# Patient Record
Sex: Female | Born: 1985 | Race: Black or African American | Hispanic: No | Marital: Single | Smoking: Never smoker
Health system: Southern US, Community
[De-identification: ages and names within clinical notes are randomized; demographics above are authoritative.]

## PROBLEM LIST (undated history)

## (undated) HISTORY — PX: WISDOM TOOTH EXTRACTION: SHX21

## (undated) HISTORY — PX: WRIST SURGERY: SHX841

---

## 2007-06-25 ENCOUNTER — Emergency Department (HOSPITAL_COMMUNITY): Admission: EM | Admit: 2007-06-25 | Discharge: 2007-06-25 | Payer: Self-pay | Admitting: Emergency Medicine

## 2008-06-12 ENCOUNTER — Emergency Department (HOSPITAL_COMMUNITY): Admission: EM | Admit: 2008-06-12 | Discharge: 2008-06-12 | Payer: Self-pay | Admitting: Emergency Medicine

## 2009-08-28 ENCOUNTER — Emergency Department (HOSPITAL_COMMUNITY): Admission: EM | Admit: 2009-08-28 | Discharge: 2009-08-29 | Payer: Self-pay | Admitting: Internal Medicine

## 2009-11-24 ENCOUNTER — Inpatient Hospital Stay (HOSPITAL_COMMUNITY): Admission: AD | Admit: 2009-11-24 | Discharge: 2009-11-26 | Payer: Self-pay | Admitting: Family Medicine

## 2009-11-24 ENCOUNTER — Encounter: Payer: Self-pay | Admitting: Family Medicine

## 2009-11-24 ENCOUNTER — Other Ambulatory Visit: Payer: Self-pay | Admitting: Emergency Medicine

## 2009-11-24 ENCOUNTER — Ambulatory Visit: Payer: Self-pay | Admitting: Obstetrics and Gynecology

## 2009-12-01 ENCOUNTER — Ambulatory Visit: Payer: Self-pay | Admitting: Family Medicine

## 2009-12-01 DIAGNOSIS — R519 Headache, unspecified: Secondary | ICD-10-CM | POA: Insufficient documentation

## 2009-12-01 DIAGNOSIS — R51 Headache: Secondary | ICD-10-CM | POA: Insufficient documentation

## 2009-12-01 DIAGNOSIS — I10 Essential (primary) hypertension: Secondary | ICD-10-CM | POA: Insufficient documentation

## 2009-12-08 ENCOUNTER — Encounter: Payer: Self-pay | Admitting: Family Medicine

## 2009-12-11 ENCOUNTER — Encounter: Payer: Self-pay | Admitting: Family Medicine

## 2009-12-15 ENCOUNTER — Ambulatory Visit: Payer: Self-pay | Admitting: Family Medicine

## 2010-01-08 ENCOUNTER — Telehealth: Payer: Self-pay | Admitting: Family Medicine

## 2010-01-22 ENCOUNTER — Ambulatory Visit: Payer: Self-pay | Admitting: Family Medicine

## 2010-02-02 ENCOUNTER — Ambulatory Visit: Payer: Self-pay | Admitting: Family Medicine

## 2010-07-20 ENCOUNTER — Telehealth: Payer: Self-pay | Admitting: Family Medicine

## 2010-07-27 ENCOUNTER — Emergency Department (HOSPITAL_COMMUNITY)
Admission: EM | Admit: 2010-07-27 | Discharge: 2010-07-27 | Payer: Self-pay | Source: Home / Self Care | Admitting: Emergency Medicine

## 2010-07-28 NOTE — Assessment & Plan Note (Signed)
Summary: post partum, tcb  [facility]  Vital Signs:  Patient profile:   25 year old female Height:      62 inches Weight:      127.6 pounds BMI:     23.42 Temp:     98.1 degrees F BP sitting:   123 / 69  Vitals Entered By: Golden Circle RN (January 22, 2010 3:21 PM) CC: pp   Primary Care Provider:  Milinda Antis MD  CC:  pp.  History of Present Illness:  25 y.o. [id number] here for postpartum visit  Breast Feeding: Adequate milk production per pt. baby breast feeding 5-6 times per day. No breast tenderness, swelling, erythema, or purulent drainage.   Birth control: Pt recieved depo shot at discharge from hospital. Pt interested in IUD for long term birth control.   GU: No dysuria, vaginal bleeding minimal. Now w/ intermittent spotting-much improved from delivery. No fever, purulent discharge, abd pain.   Psych: Pt very happy about having baby, w/ excellent social support at home. Pt's mother has "helped alot" w/ baby per pt. No HI/SI.   HTN: Recently started on HCTZ/Norvasc for new onset HTN in setting of pre-X at time of delivery s/p mag and maternal ICU stayx 24 hours. Pt has not checked BPs at home, however has had marked improvement in baseline HA. Compliant w/ HCTZ and norvasc regimen since starting 11/2009.   Allergies: No Known Drug Allergies  Physical Exam  General:  alert, appropriate dress, and normal appearance.   Head:  normocephalic and atraumatic.   Eyes:  vision grossly intact.   Ears:  R ear normal and L ear normal.   Nose:  no external deformity.   Mouth:  good dentition.   Neck:  supple and full ROM.   Lungs:  CTAB, no wheezes Heart:  RRR, no rubs, gallops, murmurs Abdomen:  S/NT/+bowel sounds Genitalia:  normal introitus and no external lesions.    Pelvic Exam Cervix:      normal, no CMT.  cervix consistent w/ non-pregnant state    Impression & Recommendations:  Problem # 1:  POSTPARTUM EXAMINATION, NORMAL (ICD-V24.2) Overall normal  postpartum exam. GYN anatomy now normalized to non-pregnant states. Reviewed anticipatory guidance about breastfeeding red flags and birth control options. Pt cleared to begin sexual activity. Pt 8 weeks postpartum w/ depo shot prior to d/c. Plan for IUD placement at next visit in 2-4 weeks.  Orders: [facility]- Est Level  3 (56433)  Problem # 2:  ESSENTIAL HYPERTENSION (ICD-401.9) BPs currently well controlled. Pt asymptomatic. WIl likely draw followup BMET at next visit.  Her updated medication list for this problem includes:    Hydrochlorothiazide 25 Mg Tabs (Hydrochlorothiazide) .Marland Kitchen... 1 by mouth daily for hypertension    Norvasc 10 Mg Tabs (Amlodipine besylate) .Marland Kitchen... 1 by mouth daily  Complete Medication List: 1)  Hydrochlorothiazide 25 Mg Tabs (Hydrochlorothiazide) .Marland Kitchen.. 1 by mouth daily for hypertension 2)  Norvasc 10 Mg Tabs (Amlodipine besylate) .Marland Kitchen.. 1 by mouth daily 3)  Fioricet 50-325-40 Mg Tabs (Butalbital-apap-caffeine) .Marland Kitchen.. 1 tab at onset of headache, may repeat in 4 hours

## 2010-07-28 NOTE — Letter (Signed)
Summary: FMLA  FMLA   Imported By: Clydell Hakim 12/11/2009 13:55:13  _____________________________________________________________________  External Attachment:    Type:   Image     Comment:   External Document

## 2010-07-28 NOTE — Assessment & Plan Note (Signed)
Summary: [facility]U VISIT/[facility]  pt rescheduled for iud  Vital Signs:  Patient profile:   25 year old female Height:      62 inches Temp:     98.8 degrees F oral Pulse rate:   78 / minute BP sitting:   118 / 67  (left arm) Cuff size:   regular  Vitals Entered By: Tessie Fass CMA (February 02, 2010 4:23 PM)  Allergies: No Known Drug Allergies   Complete Medication List: 1)  Hydrochlorothiazide 25 Mg Tabs (Hydrochlorothiazide) .Marland Kitchen.. 1 by mouth daily for hypertension 2)  Norvasc 10 Mg Tabs (Amlodipine besylate) .Marland Kitchen.. 1 by mouth daily 3)  Fioricet 50-325-40 Mg Tabs (Butalbital-apap-caffeine) .Marland Kitchen.. 1 tab at onset of headache, may repeat in 4 hours  Other Orders: No Charge Patient Arrived (NCPA0) (NCPA0)  Appended Document: [facility]U VISIT/[facility] Appt rescheduled for IUD placement. Will followup in 1-2 weeks. Doree Albee MD {DATETIMESTAMP()}

## 2010-07-28 NOTE — Assessment & Plan Note (Signed)
Summary: postpartum bp check,df   Vital Signs:  Patient profile:   25 year old female Weight:      143 pounds Temp:     98.2 degrees F oral Pulse rate:   99 / minute BP sitting:   146 / 97  (left arm) Cuff size:   regular  Vitals Entered By: Tessie Fass CMA (December 01, 2009 10:02 AM)  Serial Vital Signs/Assessments:  Time      Position  BP       Pulse  Resp  Temp     By                     144/92                         Milinda Antis MD  CC: BP check Pain Assessment Patient in pain? no        CC:  BP check.  History of Present Illness:   25 y.o. G2P1011 presents for hospital follow s/p delviery for BP check, Pt did not have any prenatal care, per report has irregular cycles and thought she was approx 4 months pregnant. She presented with SROM , full term delivery and pre-ecclampsia BP elevated to 182/109. Placed in ICU s/p NSVD , given  Magnesium. At discharge started on HCTZ 25mg  and Norvasc 10mg . Pt admitted prior to presented to [facility]U has appt for Wed prior secondary to extreme swelling in legs and hands as well as severe headaches. No prior history of HTN  Continues to have headaches daily, no vision changes, feel like severe migraines at time, Tylenol helps some , swelling in legs improving, denies N/V.    Habits & Providers  Alcohol-Tobacco-Diet     Tobacco Status: never  Current Medications (verified): 1)  Hydrochlorothiazide 25 Mg Tabs (Hydrochlorothiazide) .Marland Kitchen.. 1 By Mouth Daily For Hypertension 2)  Norvasc 10 Mg Tabs (Amlodipine Besylate) .Marland Kitchen.. 1 By Mouth Daily 3)  Fioricet 50-325-40 Mg Tabs (Butalbital-Apap-Caffeine) .Marland Kitchen.. 1 Tab At Onset of Headache, May Repeat in 4 Hours  Allergies (verified): No Known Drug Allergies  Past History:  Past Medical History: Vit D deficient Lactose intolerant  H/o Trichomonas Vaginalis [id number] - NSVD May 2011 with Pre-ecclampsia     Family History: Father- HTN , Gout Father's side- Alzhiemers Dementia Mother- Mild  Asthma  GM- glaucoma   No family history of DM  Social History: Lives with boyfriend, and baby Never smoked, alcohol occasionally, no illicit drug use Exercise  Works Surveyor, minerals at [employer]- plans for degree in ChemistrySmoking Status:  never  Physical Exam  General:  NAD, pleaseant female, Vital signs noted  Eyes:  Contacts, PERRL,EOMI, Optic disc normal Neck:  supple.   Lungs:  CTAB Heart:  RRR, no murmur Pulses:  2+ Extremities:  2 + pitting edema to Knee bilat, hyperpigmentation over feet bilat  Neurologic:  alert & oriented X3, cranial nerves II-XII intact, and strength normal in all extremities.     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 BP has greatly improved on 2 drug regimine. Recheck in 2 weeks.  Her updated medication list for this problem includes:    Hydrochlorothiazide 25 Mg Tabs (Hydrochlorothiazide) .Marland Kitchen... 1 by mouth daily for hypertension    Norvasc  10 Mg Tabs (Amlodipine besylate) .Marland Kitchen... 1 by mouth daily  Problem # 2:  HEADACHE (ICD-784.0) Assessment: New Related to pre-ecclampsia, noted improvment with treatment of BP, given red flags, as breastfeeding given Fiorcet no NSAIDS at this time Her updated medication list for this problem includes:    Fioricet 50-325-40 Mg Tabs (Butalbital-apap-caffeine) .Marland Kitchen... 1 tab at onset of headache, may repeat in 4 hours  Complete Medication List: 1)  Hydrochlorothiazide 25 Mg Tabs (Hydrochlorothiazide) .Marland Kitchen.. 1 by mouth daily for hypertension 2)  Norvasc 10 Mg Tabs (Amlodipine besylate) .Marland Kitchen.. 1 by mouth daily 3)  Fioricet 50-325-40 Mg Tabs (Butalbital-apap-caffeine) .Marland Kitchen.. 1 tab at onset of headache, may repeat in 4 hours  Patient Instructions: 1)  It was very nice to meet you! Congratulations  on your new baby. 2)  Return to for your post partum check in 6 weeks, please schedule at the front 3)  Continue your current meds. 4)  Return in 2 weeks for have your blood pressure checked by the nurse 5)  If you have severe headache, change in vision, worse swelling, nausea please call us or go to nearest ER  Prescriptions: FIORICET 50-325-40 MG TABS (BUTALBITAL-APAP-CAFFEINE) 1 tab at onset of headache, may repeat in 4 hours  #30 x 0   Entered and Authorized by:   Milinda Antis MD   Signed by:   Milinda Antis MD on 12/01/2009   Method used:   Handwritten   RxID:   [id number] NORVASC 10 MG TABS (AMLODIPINE BESYLATE) 1 by mouth daily  #30 x 0   Entered and Authorized by:   Milinda Antis MD   Signed by:   Milinda Antis MD on 12/01/2009   Method used:   Historical   RxID:   [id number] HYDROCHLOROTHIAZIDE 25 MG TABS (HYDROCHLOROTHIAZIDE) 1 by mouth daily for hypertension  #30 x 0   Entered and Authorized by:   Milinda Antis MD   Signed by:   Milinda Antis MD on 12/01/2009   Method used:   Historical   RxID:   [id number]

## 2010-07-28 NOTE — Progress Notes (Signed)
Summary: Letter  Phone Note Call from Patient Call back at Home Phone [phone]   Reason for Call: Talk to Nurse Summary of Call: would like to get a letter for her mom so she can come help her with the baby Initial call taken by: Knox Royalty,  January 08, 2010 9:27 AM  Follow-up for Phone Call        called pt. pt needs refills of all of her meds (3). her pharmacy is [facility] [address]. c/o bad migraine. out of meds. told pt that we will refill her meds and advised to keep appt. pt request for dr.Spickard to fax a letter to her @ [phone], stating that she needs more help with her baby from her mom due to her headaches. i told the pt that i would fwd her request to dr.Cairo. Follow-up by: Arlyss Repress CMA,,  January 08, 2010 9:41 AM  Additional Follow-up for Phone Call Additional follow up Details #1::        I spoke with pt, I will not be able to give a specific letter for her mother, I have completed the FMLA form, her mother is not a pt and neither is her child. I advised her mother to call her own pcp, since her FMLA is pending, this was given approx 5 weeks ago. Will refill bp meds Additional Follow-up by: Milinda Antis MD,  January 08, 2010 12:33 PM    Prescriptions: Marikay Alar 50-325-40 MG TABS [facility]) 1 tab at onset of headache, may repeat in 4 hours  #30 x 0   Entered and Authorized by:   Milinda Antis MD   Signed by:   Milinda Antis MD on 01/08/2010   Method used:   Electronically to        [facility]. [phone]* (retail)       [address]       [place], [place]  [address]       Ph: [phone]       Fax: [phone]   RxID:   [id number] HYDROCHLOROTHIAZIDE 25 MG TABS (HYDROCHLOROTHIAZIDE) 1 by mouth daily for hypertension  #30 x 1   Entered and Authorized by:   Milinda Antis MD   Signed by:   Milinda Antis MD on 01/08/2010   Method used:   Electronically to        [facility]. [phone]*  (retail)       [address]       [place], [place]  [address]       Ph: [phone]       Fax: [phone]   RxID:   [id number] NORVASC 10 MG TABS (AMLODIPINE BESYLATE) 1 by mouth daily  #30 x 1   Entered and Authorized by:   Milinda Antis MD   Signed by:   Milinda Antis MD on 01/08/2010   Method used:   Electronically to        [facility]. [phone]* (retail)       [address]       [place], [place]  [address]       Ph: [phone]       Fax: [phone]   RxID:   [id number]

## 2010-07-28 NOTE — Assessment & Plan Note (Signed)
Summary: bp check/[facility]  Nurse Visit  patient in office for BP recheck. she started on medciation after the birth of her baby 3 weeks ago.. she did take medication this AM. states she is on norvasc and HCTZ. BP cheked manually after resting 10 minutes BP LA 1101/74, RA 108/74 pulse 88. has a follow up appointment with Dr. Hubbard Robinson on 01/12/2010. Theresia Lo RN  December 15, 2009 9:42 AM   Allergies: No Known Drug Allergies  Orders Added: 1)  No Charge Patient Arrived (NCPA0) [NCPA0]

## 2010-07-28 NOTE — Miscellaneous (Signed)
Summary: ROI  ROI   Imported By: Bradly Bienenstock 12/08/2009 10:23:48  _____________________________________________________________________  External Attachment:    Type:   Image     Comment:   External Document

## 2010-07-29 ENCOUNTER — Encounter: Payer: Self-pay | Admitting: *Deleted

## 2010-07-30 NOTE — Progress Notes (Signed)
Summary: triage  Phone Note Call from Patient Call back at Home Phone [phone]   Caller: Patient Summary of Call: wants to know what she can take for a cold/congestion Initial call taken by: De Nurse,  July 20, 2010 2:03 PM  Follow-up for Phone Call        SHE IS BREASTFEEDING.  sick x 4 days. spoke with Dr. Tressia Danas. nasal spray & nyquil advised. may suck on hard candy & get plenty of fluids. son is 67 months old with teeth so she pumps & FOB feeds him a bottle at night. discussed how all of the other cold remedies will dry up her milk supply to move the humidifier into her bedroom Follow-up by: Golden Circle RN,  July 20, 2010 2:53 PM

## 2010-09-14 LAB — DIFFERENTIAL
Basophils Absolute: 0 10*3/uL (ref 0.0–0.1)
Basophils Relative: 0 % (ref 0–1)
Eosinophils Absolute: 0 10*3/uL (ref 0.0–0.7)
Lymphocytes Relative: 16 % (ref 12–46)

## 2010-09-14 LAB — CBC
HCT: 29.8 % — ABNORMAL LOW (ref 36.0–46.0)
HCT: 32.6 % — ABNORMAL LOW (ref 36.0–46.0)
HCT: 34 % — ABNORMAL LOW (ref 36.0–46.0)
Hemoglobin: 10.9 g/dL — ABNORMAL LOW (ref 12.0–15.0)
MCHC: 33.4 g/dL (ref 30.0–36.0)
MCHC: 33.5 g/dL (ref 30.0–36.0)
MCV: 89.4 fL (ref 78.0–100.0)
Platelets: 200 10*3/uL (ref 150–400)
Platelets: 210 10*3/uL (ref 150–400)
RBC: 3.33 MIL/uL — ABNORMAL LOW (ref 3.87–5.11)
RBC: 3.71 MIL/uL — ABNORMAL LOW (ref 3.87–5.11)
RDW: 17 % — ABNORMAL HIGH (ref 11.5–15.5)
RDW: 17.1 % — ABNORMAL HIGH (ref 11.5–15.5)
WBC: 10.5 10*3/uL (ref 4.0–10.5)
WBC: 15.2 10*3/uL — ABNORMAL HIGH (ref 4.0–10.5)
WBC: 19.1 10*3/uL — ABNORMAL HIGH (ref 4.0–10.5)

## 2010-09-14 LAB — URINALYSIS, ROUTINE W REFLEX MICROSCOPIC
Ketones, ur: NEGATIVE mg/dL
Nitrite: NEGATIVE
Protein, ur: >300 mg/dL — AB
Specific Gravity, Urine: 1.02 (ref 1.005–1.030)
pH: 6.5 (ref 5.0–8.0)

## 2010-09-14 LAB — HEPATITIS B SURFACE ANTIGEN: Hepatitis B Surface Ag: NEGATIVE

## 2010-09-14 LAB — GC/CHLAMYDIA PROBE AMP, GENITAL
Chlamydia, DNA Probe: NEGATIVE
GC Probe Amp, Genital: NEGATIVE

## 2010-09-14 LAB — WET PREP, GENITAL
Clue Cells Wet Prep HPF POC: NONE SEEN
WBC, Wet Prep HPF POC: NONE SEEN
Yeast Wet Prep HPF POC: NONE SEEN

## 2010-09-14 LAB — RPR: RPR Ser Ql: NONREACTIVE

## 2010-09-14 LAB — COMPREHENSIVE METABOLIC PANEL
ALT: 15 U/L (ref 0–35)
AST: 31 U/L (ref 0–37)
Albumin: 1.9 g/dL — ABNORMAL LOW (ref 3.5–5.2)
Albumin: 2.1 g/dL — ABNORMAL LOW (ref 3.5–5.2)
Alkaline Phosphatase: 137 U/L — ABNORMAL HIGH (ref 39–117)
BUN: 4 mg/dL — ABNORMAL LOW (ref 6–23)
CO2: 23 mEq/L (ref 19–32)
Chloride: 107 mEq/L (ref 96–112)
GFR calc Af Amer: >60 mL/min (ref >60)
GFR calc Af Amer: >60 mL/min (ref >60)
Sodium: 135 mEq/L (ref 135–145)
Total Bilirubin: 0.5 mg/dL (ref 0.3–1.2)
Total Protein: 5.3 g/dL — ABNORMAL LOW (ref 6.0–8.3)

## 2010-09-14 LAB — RAPID URINE DRUG SCREEN, HOSP PERFORMED
Cocaine: NOT DETECTED
Opiates: NOT DETECTED
Tetrahydrocannabinol: NOT DETECTED

## 2010-09-14 LAB — TYPE AND SCREEN: Antibody Screen: NEGATIVE

## 2010-09-14 LAB — URINE MICROSCOPIC-ADD ON

## 2010-09-14 LAB — RUBELLA SCREEN: Rubella: 39.5 IU/mL — ABNORMAL HIGH

## 2010-09-14 LAB — MRSA PCR SCREENING: MRSA by PCR: NEGATIVE

## 2010-09-14 LAB — ABO/RH: ABO/RH(D): O POS

## 2012-09-28 ENCOUNTER — Ambulatory Visit: Payer: Self-pay

## 2012-09-28 ENCOUNTER — Other Ambulatory Visit: Payer: Self-pay | Admitting: Occupational Medicine

## 2012-09-28 DIAGNOSIS — Z021 Encounter for pre-employment examination: Secondary | ICD-10-CM

## 2013-06-04 ENCOUNTER — Emergency Department (HOSPITAL_COMMUNITY)
Admission: EM | Admit: 2013-06-04 | Discharge: 2013-06-04 | Disposition: A | Discharge status: Home / Self Care | Payer: BC Managed Care – PPO | Attending: Emergency Medicine | Admitting: Emergency Medicine

## 2013-06-04 ENCOUNTER — Encounter (HOSPITAL_COMMUNITY): Payer: Self-pay | Admitting: Emergency Medicine

## 2013-06-04 ENCOUNTER — Emergency Department (HOSPITAL_COMMUNITY): Payer: BC Managed Care – PPO

## 2013-06-04 DIAGNOSIS — R05 Cough: Secondary | ICD-10-CM | POA: Insufficient documentation

## 2013-06-04 DIAGNOSIS — Z79899 Other long term (current) drug therapy: Secondary | ICD-10-CM | POA: Insufficient documentation

## 2013-06-04 DIAGNOSIS — R509 Fever, unspecified: Secondary | ICD-10-CM | POA: Insufficient documentation

## 2013-06-04 DIAGNOSIS — N12 Tubulo-interstitial nephritis, not specified as acute or chronic: Secondary | ICD-10-CM

## 2013-06-04 DIAGNOSIS — IMO0001 Reserved for inherently not codable concepts without codable children: Secondary | ICD-10-CM | POA: Insufficient documentation

## 2013-06-04 DIAGNOSIS — R059 Cough, unspecified: Secondary | ICD-10-CM | POA: Insufficient documentation

## 2013-06-04 DIAGNOSIS — J3489 Other specified disorders of nose and nasal sinuses: Secondary | ICD-10-CM | POA: Insufficient documentation

## 2013-06-04 DIAGNOSIS — R5381 Other malaise: Secondary | ICD-10-CM | POA: Insufficient documentation

## 2013-06-04 LAB — URINALYSIS, ROUTINE W REFLEX MICROSCOPIC
Bilirubin Urine: NEGATIVE
Glucose, UA: NEGATIVE mg/dL
Ketones, ur: NEGATIVE mg/dL
Nitrite: POSITIVE — AB
Protein, ur: 30 mg/dL — AB
Specific Gravity, Urine: 1.013 (ref 1.005–1.030)
Urobilinogen, UA: 1 mg/dL (ref 0.0–1.0)
pH: 5.5 (ref 5.0–8.0)

## 2013-06-04 LAB — URINE MICROSCOPIC-ADD ON

## 2013-06-04 MED ORDER — CIPROFLOXACIN HCL 500 MG PO TABS: 500.0000 mg | ORAL_TABLET | Freq: Two times a day (BID) | ORAL | Status: DC

## 2013-06-04 MED ORDER — IBUPROFEN 800 MG PO TABS
800.0000 mg | ORAL_TABLET | Freq: Once | ORAL | Status: AC
  Administered 2013-06-04: 800 mg via ORAL
  Filled 2013-06-04: qty 1

## 2013-06-04 MED ORDER — PHENAZOPYRIDINE HCL 200 MG PO TABS: 200.0000 mg | ORAL_TABLET | Freq: Three times a day (TID) | ORAL | Status: DC

## 2013-06-04 NOTE — ED Provider Notes (Signed)
CSN: [id number]     Arrival date & time 06/04/13  1318 History   First MD Initiated Contact with Patient 06/04/13 1507     Chief Complaint  Patient presents with  . Back Pain   (Consider location/radiation/quality/duration/timing/severity/associated sxs/prior Treatment) HPI Pt is a 27yo female c/o back pain and generalized weakness, associated with fever of 102-103.  Pt states she went to [facility] walk-in clinic Friday, 12/5, dx with "a touch of pneumonia" advised to alternate tylenol and ibuprofen for fevers every 4 hours but states she has not felt any improvement in her symptoms. Still reports generalized fatigue and recurrent fevers with aching, sore back, 8/10. Also reports mild productive cough with mild sinus congestion. Does report some urinary frequency but no hematuria or dysuria. Denies vaginal pain or discharge. Currently on her menstrual cycle. Pt states she is generally healthy, no daily medications. Denies hx of asthma.     History reviewed. No pertinent past medical history. History reviewed. No pertinent past surgical history. No family history on file. History  Substance Use Topics  . Smoking status: Never Smoker   . Smokeless tobacco: Not on file  . Alcohol Use: No   OB History   Grav Para Term Preterm Abortions TAB SAB Ect Mult Living                 Review of Systems  Constitutional: Positive for fever, chills and fatigue. Negative for appetite change and unexpected weight change.  HENT: Positive for congestion.   Respiratory: Positive for cough ( mild). Negative for shortness of breath.   Cardiovascular: Negative for chest pain.  Gastrointestinal: Negative for nausea, vomiting and abdominal pain.  Genitourinary: Positive for frequency. Negative for dysuria, hematuria, flank pain, vaginal bleeding, vaginal discharge, menstrual problem and pelvic pain.  Musculoskeletal: Positive for back pain and myalgias.  All other systems reviewed and are  negative.    Allergies  Review of patient's allergies indicates no known allergies.  Home Medications   Current Outpatient Rx  Name  Route  Sig  Dispense  Refill  . norgestrel-ethinyl estradiol (LO/OVRAL,CRYSELLE) 0.3-30 MG-MCG tablet   Oral   Take 1 tablet by mouth daily.         Marland Kitchen OVER THE COUNTER MEDICATION   Oral   Take 1 capsule by mouth daily. Hair, Skin and Nails         . ciprofloxacin (CIPRO) 500 MG tablet   Oral   Take 1 tablet (500 mg total) by mouth every 12 (twelve) hours.   20 tablet   0   . phenazopyridine (PYRIDIUM) 200 MG tablet   Oral   Take 1 tablet (200 mg total) by mouth 3 (three) times daily.   6 tablet   0    BP 115/59  Pulse 82  Temp(Src) 99.3 F (37.4 C) (Oral)  Resp 20  Ht 5\' 2"  (1.575 m)  Wt 140 lb (63.504 kg)  BMI 25.60 kg/m2  SpO2 99%  LMP 06/04/2013 Physical Exam  Nursing note and vitals reviewed. Constitutional: She appears well-developed and well-nourished. No distress.  Pt appears well, non-toxic. NAD.  HENT:  Head: Normocephalic and atraumatic.  Eyes: Conjunctivae are normal. No scleral icterus.  Neck: Normal range of motion.  Cardiovascular: Normal rate, regular rhythm and normal heart sounds.   Pulmonary/Chest: Effort normal and breath sounds normal. No respiratory distress. She has no wheezes. She has no rales. She exhibits no tenderness.  No respiratory distress, able to speak in full sentences w/o difficulty.  Lungs: CTAB   Abdominal: Soft. Bowel sounds are normal. She exhibits no distension and no mass. There is no tenderness. There is no rebound and no guarding.  Soft, non-distended, non-tender.  Musculoskeletal: Normal range of motion. She exhibits tenderness. She exhibits no edema.  Diffuse muscular tenderness over back  Neurological: She is alert.  Skin: Skin is warm and dry. She is not diaphoretic.    ED Course  Procedures (including critical care time) Labs Review Labs Reviewed  URINALYSIS, ROUTINE W  REFLEX MICROSCOPIC - Abnormal; Notable for the following:    APPearance CLOUDY (*)    Hgb urine dipstick LARGE (*)    Protein, ur 30 (*)    Nitrite POSITIVE (*)    Leukocytes, UA LARGE (*)    All other components within normal limits  URINE MICROSCOPIC-ADD ON - Abnormal; Notable for the following:    Squamous Epithelial / LPF FEW (*)    Bacteria, UA MANY (*)    All other components within normal limits  URINE CULTURE   Imaging Review Dg Chest 2 View  06/04/2013   CLINICAL DATA:  Shortness of breath, fever and cough.  EXAM: CHEST  2 VIEW  COMPARISON:  09/28/2012.  FINDINGS: The heart size and mediastinal contours are within normal limits. Both lungs are clear. The visualized skeletal structures are unremarkable.  IMPRESSION: No active cardiopulmonary disease.   Electronically Signed   By: Amie Portland M.D.   On: 06/04/2013 16:35    EKG Interpretation   None       MDM   1. Pyelonephritis    Pt presenting with back pain, fevers, generalized weakness and urinary frequency.  Was dx Friday, 12/5, with "a touch of pneumonia." Today, pt appears well, non-toxic. Pt reports mild productive cough, lungs: CTAB, although due to pt's concern for pneumonia, will get CXR.  Will also get UA as pt reports urinary frequency and back pain but no abdominal pain.    CXR: unremarkable UA: significant for urinary infection, nitrites-positive, WBC-TNTC.  Due to pt's severe back pain and reported fevers of 102-103, will tx for pyelonephritis.  Rx: cirpo x10 days. Advised to f/u with PCP in 3 days if not improving, return precautions provided. Pt verbalized understanding and agreement with tx plan.     Junius Finner, PA-C 06/04/13 1735

## 2013-06-04 NOTE — ED Notes (Signed)
Pt. reports pain at entire back , bilateral shoulders , generalized weakness and headache for several days , seen at [facility] walk-in clinic diagnosed with " touch of pneumonia " advised to take OTC Tylenol and Ibuprofen with no relief.

## 2013-06-05 NOTE — ED Provider Notes (Signed)
Medical screening examination/treatment/procedure(s) were performed by non-physician practitioner and as supervising physician I was immediately available for consultation/collaboration.  EKG Interpretation   None        Raeford Razor, MD 06/05/13 1454

## 2013-06-06 LAB — URINE CULTURE: Colony Count: >=100000

## 2015-01-27 ENCOUNTER — Encounter (HOSPITAL_COMMUNITY): Payer: Self-pay | Admitting: *Deleted

## 2015-01-27 ENCOUNTER — Inpatient Hospital Stay (HOSPITAL_COMMUNITY)
Admission: AD | Admit: 2015-01-27 | Discharge: 2015-01-27 | Disposition: A | Discharge status: Home / Self Care | Payer: BLUE CROSS/BLUE SHIELD | Source: Ambulatory Visit | Attending: Obstetrics and Gynecology | Admitting: Obstetrics and Gynecology

## 2015-01-27 ENCOUNTER — Inpatient Hospital Stay (HOSPITAL_COMMUNITY): Payer: BLUE CROSS/BLUE SHIELD

## 2015-01-27 DIAGNOSIS — Z6791 Unspecified blood type, Rh negative: Secondary | ICD-10-CM | POA: Insufficient documentation

## 2015-01-27 DIAGNOSIS — O4690 Antepartum hemorrhage, unspecified, unspecified trimester: Secondary | ICD-10-CM | POA: Insufficient documentation

## 2015-01-27 DIAGNOSIS — O26899 Other specified pregnancy related conditions, unspecified trimester: Secondary | ICD-10-CM | POA: Diagnosis not present

## 2015-01-27 DIAGNOSIS — O3680X0 Pregnancy with inconclusive fetal viability, not applicable or unspecified: Secondary | ICD-10-CM

## 2015-01-27 DIAGNOSIS — Z6790 Unspecified blood type, Rh positive: Secondary | ICD-10-CM

## 2015-01-27 DIAGNOSIS — Z3A00 Weeks of gestation of pregnancy not specified: Secondary | ICD-10-CM | POA: Insufficient documentation

## 2015-01-27 DIAGNOSIS — O209 Hemorrhage in early pregnancy, unspecified: Secondary | ICD-10-CM

## 2015-01-27 LAB — HCG, QUANTITATIVE, PREGNANCY: hCG, Beta Chain, Quant, S: 798 m[IU]/mL — ABNORMAL HIGH (ref <5)

## 2015-01-27 NOTE — MAU Note (Signed)
C/o heavy bleeding this AM around 1200 had lighter bleeding from 0700 til noon; OB office sent pt to MAU;

## 2015-01-27 NOTE — MAU Provider Note (Signed)
History     CSN: [id number]  Arrival date and time: 01/27/15 1431   None     Chief Complaint  Patient presents with  . Vaginal Bleeding   HPI Comments: G2P1001, unsure LMP with positive hpt on 12/30/14 c/o heavy bleeding and cramping since mid morning today. Soaked through clothes a couple times. Seen in office-quant 680, Hgb 12.0, ABO-Rh pending. Unplanned pregnancy, inconsistent use of OCPs. Cramping and bleeding has decreased since arrival-soaked pad over last hr, no clots.  Vaginal Bleeding Associated symptoms include abdominal pain.    OB History    Gravida Para Term Preterm AB TAB SAB Ectopic Multiple Living   History reviewed. No pertinent past medical history.  Past Surgical History  Procedure Laterality Date  . Wisdom tooth extraction    . Wrist surgery      Family History  Problem Relation Age of Onset  . Glaucoma Mother   . Hypertension Father   . Diabetes Father   . Hypertension Maternal Aunt   . Hypertension Maternal Uncle   . Hypertension Paternal Grandmother   . Dementia Paternal Grandmother   . Hypertension Paternal Grandfather     History  Substance Use Topics  . Smoking status: Never Smoker   . Smokeless tobacco: Not on file  . Alcohol Use: No    Allergies: No Known Allergies   Review of Systems  Constitutional: Negative.   HENT: Negative.   Eyes: Negative.   Respiratory: Negative.   Cardiovascular: Negative.   Gastrointestinal: Positive for abdominal pain.       Cramping  Genitourinary: Negative.        Vaginal bleeding  Musculoskeletal: Negative.   Skin: Negative.   Neurological: Negative.   Endo/Heme/Allergies: Negative.   Psychiatric/Behavioral: Negative.    Physical Exam   Blood pressure 129/78, pulse 92, temperature 98.5 F (36.9 C), temperature source Oral, resp. rate 18.  Physical Exam  Constitutional: She is oriented to person, place, and time. She appears well-developed and well-nourished.  HENT:   Head: Normocephalic and atraumatic.  Neck: Normal range of motion. Neck supple.  Cardiovascular: Normal rate and regular rhythm.   Respiratory: Effort normal and breath sounds normal.  GI: Soft. Bowel sounds are normal. There is no tenderness. There is no rebound and no guarding.  Genitourinary:  peripad saturated w/BRB-changed out Speculum exam deferred  Musculoskeletal: Normal range of motion.  Neurological: She is alert and oriented to person, place, and time.  Skin: Skin is warm and dry.  Psychiatric: She has a normal mood and affect.   EXAM: OBSTETRIC <14 WK [place] AND TRANSVAGINAL OB US  TECHNIQUE: Both transabdominal and transvaginal ultrasound examinations were performed for complete evaluation of the gestation as well as the maternal uterus, adnexal regions, and pelvic cul-de-sac. Transvaginal technique was performed to assess early pregnancy.  COMPARISON: None.  FINDINGS: Intrauterine gestational sac: Not present  Yolk sac: Not present  Embryo: Not present  Cardiac Activity: Not present  Maternal uterus/adnexae: The uterus is retroverted. The left and right ovaries are grossly unremarkable. No definite adnexal mass is identified. There is a trace amount of free fluid in the pelvis.  IMPRESSION: In the setting of positive pregnancy test and no definite intrauterine pregnancy, this reflects a pregnancy of unknown location. Differential considerations include early normal IUP, abnormal IUP, or nonvisualized ectopic pregnancy. Differentiation is achieved with serial beta HCG supplemented by repeat sonography as clinically warranted.  MAU Course  Procedures  Sono Quant 1715-bleeding and pain minimal, VSS  Assessment and Plan  Vaginal bleeding in pregnancy Pregnancy of unknown location-likely SAB, cannot r/o ectopic Rh positive  Discharge home Bleeding/ectopic precautions Ibuprofen 600 mg q6 hrs prn Rest-OOW until f/u, maintain good hydration  and nutrition Follow-up at [facility] for labs in 3 days for quant and CBC  Consult with Dr. Billy Coast on A/P, agrees.  Hassan Blackshire, N 01/27/2015, 3:33 PM

## 2015-01-27 NOTE — Discharge Instructions (Signed)

## 2015-12-02 ENCOUNTER — Encounter (HOSPITAL_COMMUNITY): Payer: Self-pay | Admitting: *Deleted

## 2016-09-30 IMAGING — US US OB TRANSVAGINAL
1 series · 14 of 28 positions shown · non-contrast
Comparison: None.

CLINICAL DATA: Patient with bleeding in early pregnancy. Passing
clots.

EXAM:
OBSTETRIC <14 WK US AND TRANSVAGINAL OB US
TECHNIQUE: Both transabdominal and transvaginal ultrasound examinations were
performed for complete evaluation of the gestation as well as the
maternal uterus, adnexal regions, and pelvic cul-de-sac.
Transvaginal technique was performed to assess early pregnancy.

[Series 1: us ob transvaginal · 14 of 78 slices shown]
[im 3/78]
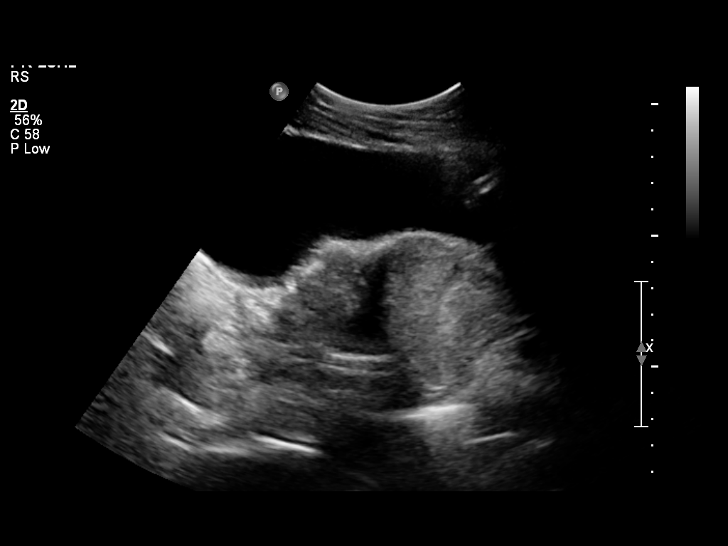
[im 9/78]
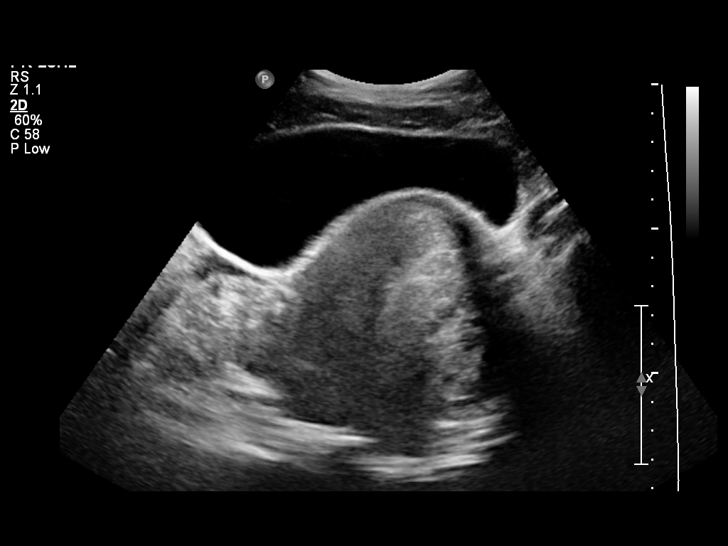
[im 15/78]
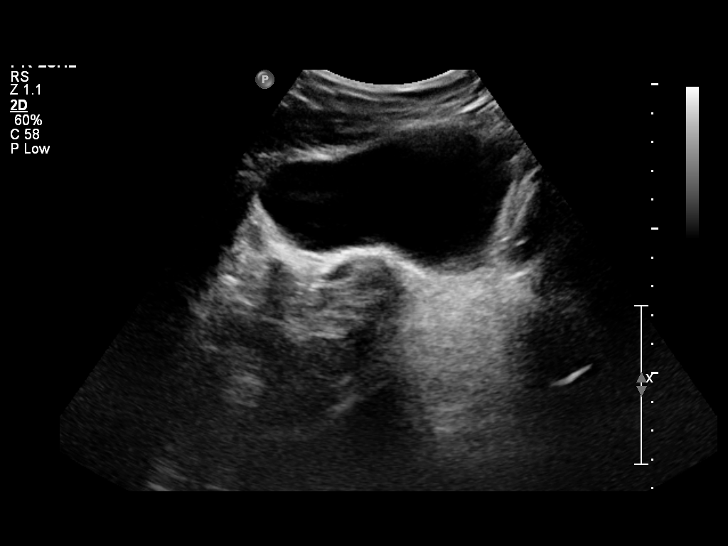
[im 20/78]
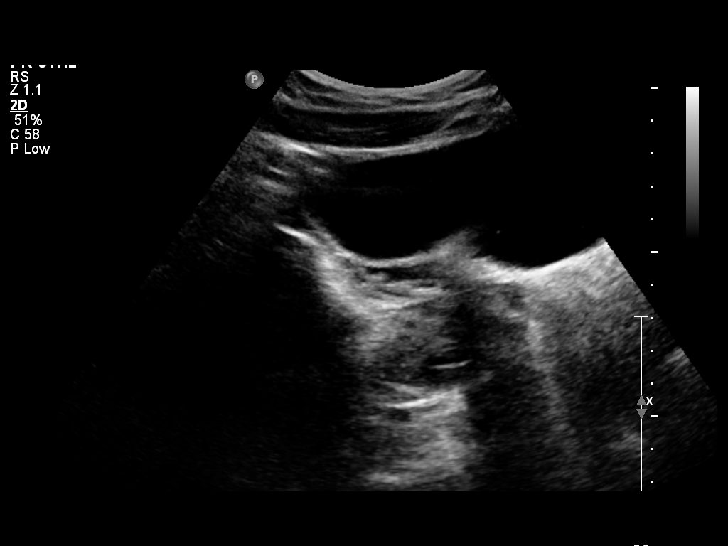
[im 26/78]
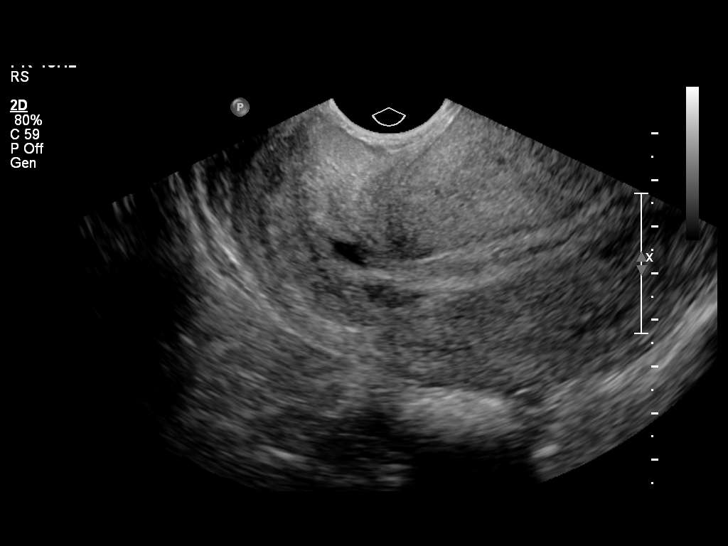
[im 32/78]
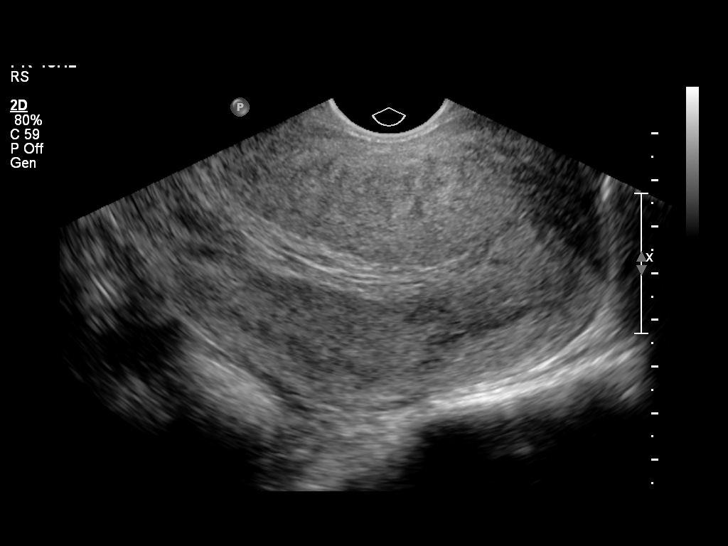
[im 38/78]
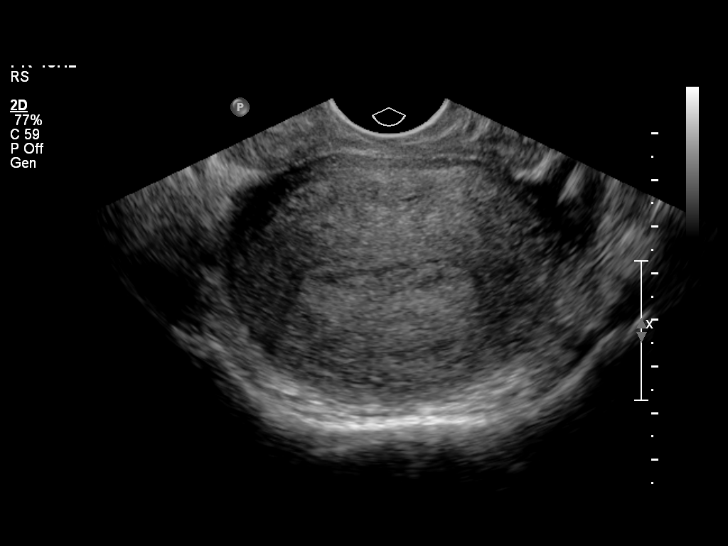
[im 43/78]
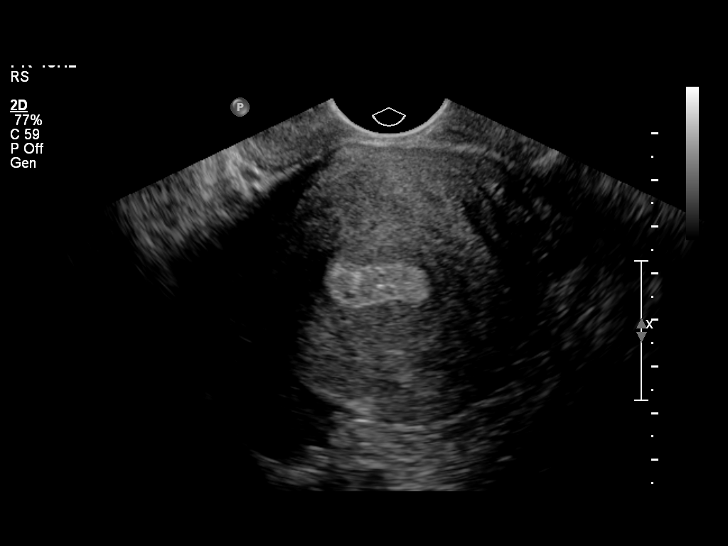
[im 49/78]
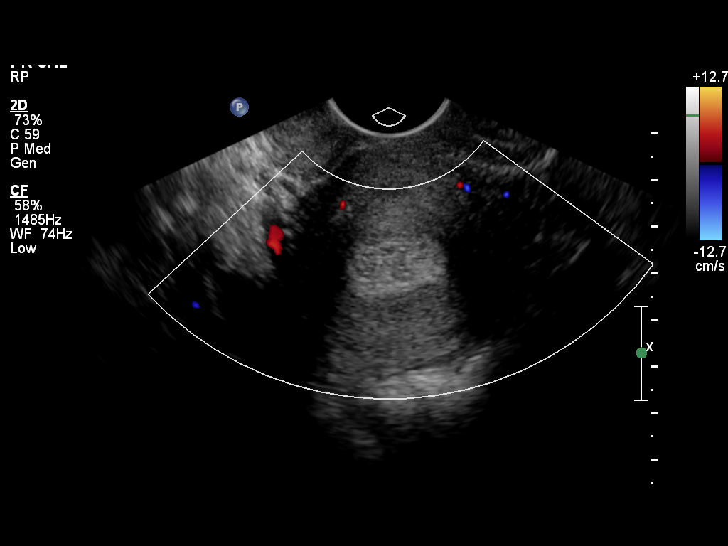
[im 55/78]
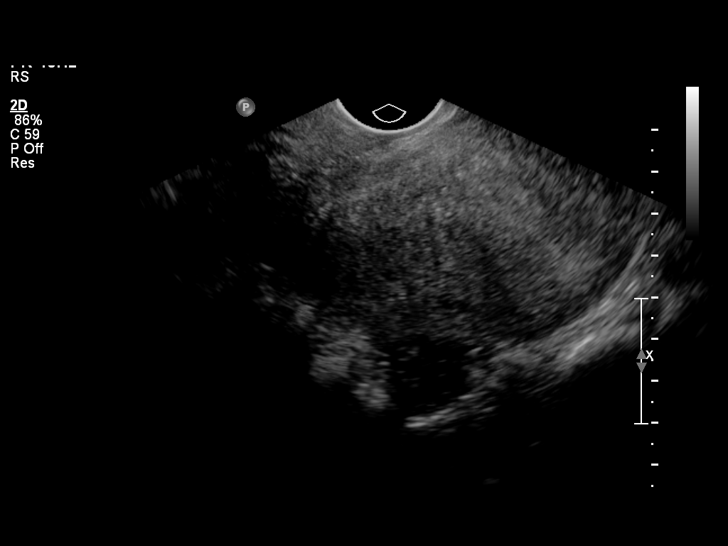
[im 60/78]
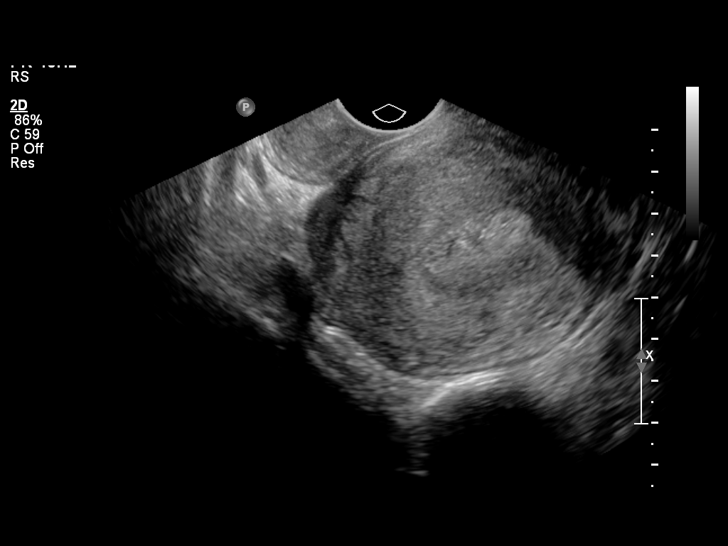
[im 66/78]
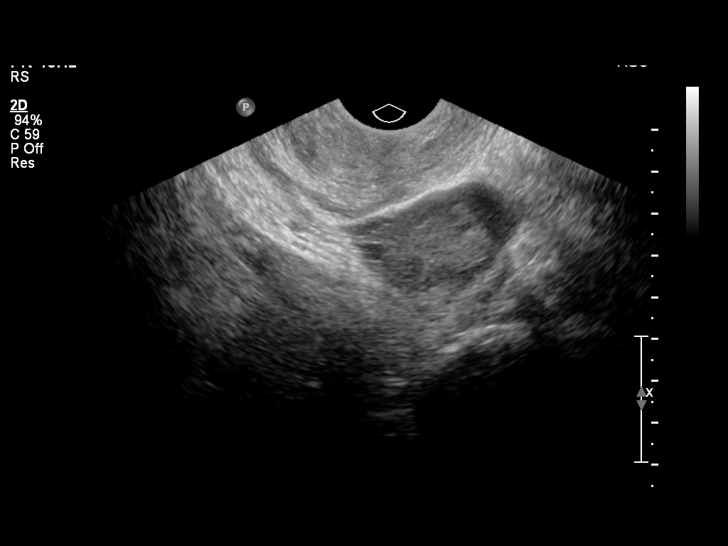
[im 72/78]
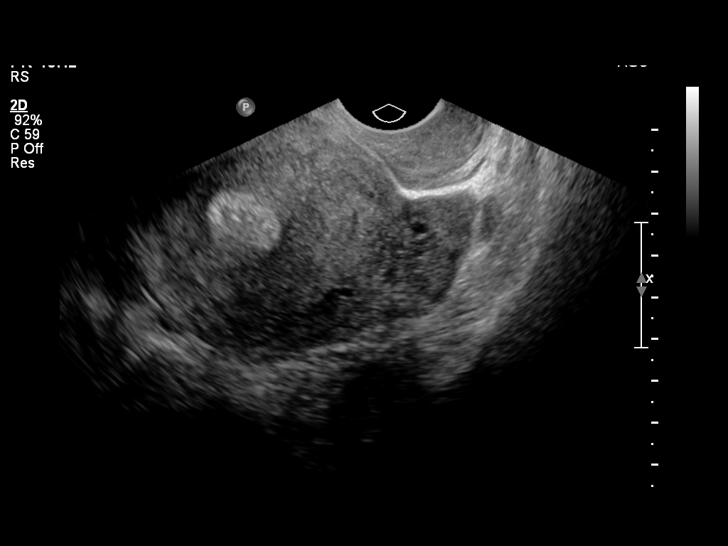
[im 78/78]
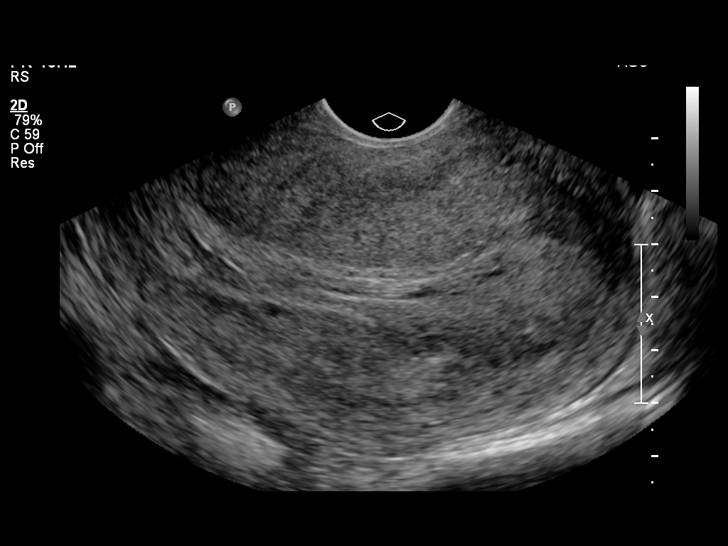

[14 of 28 positions shown; findings below may reference images not displayed]

FINDINGS: Intrauterine gestational sac: Not present

Yolk sac:  Not present

Embryo:  Not present

Cardiac Activity: Not present

Maternal uterus/adnexae: The uterus is retroverted. The left and
right ovaries are grossly unremarkable. No definite adnexal mass is
identified. There is a trace amount of free fluid in the pelvis.
IMPRESSION: In the setting of positive pregnancy test and no definite
intrauterine pregnancy, this reflects a pregnancy of unknown
location. Differential considerations include early normal IUP,
abnormal IUP, or nonvisualized ectopic pregnancy. Differentiation is
achieved with serial beta HCG supplemented by repeat sonography as
clinically warranted.
